# Patient Record
Sex: Male | Born: 1955 | Race: Black or African American | Hispanic: No | Marital: Single | Smoking: Never smoker
Health system: Southern US, Community
[De-identification: ages and names within clinical notes are randomized; demographics above are authoritative.]

---

## 2018-05-09 ENCOUNTER — Emergency Department
Admission: EM | Admit: 2018-05-09 | Discharge: 2018-05-09 | Disposition: A | Discharge status: Home / Self Care | Payer: 59 | Source: Home / Self Care | Attending: Emergency Medicine | Admitting: Emergency Medicine

## 2018-05-09 ENCOUNTER — Emergency Department (INDEPENDENT_AMBULATORY_CARE_PROVIDER_SITE_OTHER): Payer: 59

## 2018-05-09 ENCOUNTER — Encounter: Payer: Self-pay | Admitting: Emergency Medicine

## 2018-05-09 ENCOUNTER — Other Ambulatory Visit: Payer: Self-pay

## 2018-05-09 DIAGNOSIS — N429 Disorder of prostate, unspecified: Secondary | ICD-10-CM

## 2018-05-09 DIAGNOSIS — R079 Chest pain, unspecified: Secondary | ICD-10-CM

## 2018-05-09 DIAGNOSIS — M545 Low back pain: Secondary | ICD-10-CM | POA: Diagnosis not present

## 2018-05-09 DIAGNOSIS — R52 Pain, unspecified: Secondary | ICD-10-CM

## 2018-05-09 DIAGNOSIS — R109 Unspecified abdominal pain: Secondary | ICD-10-CM

## 2018-05-09 DIAGNOSIS — R0789 Other chest pain: Secondary | ICD-10-CM

## 2018-05-09 DIAGNOSIS — D649 Anemia, unspecified: Secondary | ICD-10-CM

## 2018-05-09 DIAGNOSIS — R937 Abnormal findings on diagnostic imaging of other parts of musculoskeletal system: Secondary | ICD-10-CM

## 2018-05-09 DIAGNOSIS — K5900 Constipation, unspecified: Secondary | ICD-10-CM | POA: Diagnosis not present

## 2018-05-09 DIAGNOSIS — M5442 Lumbago with sciatica, left side: Secondary | ICD-10-CM

## 2018-05-09 LAB — POCT CBC W AUTO DIFF (K'VILLE URGENT CARE)

## 2018-05-09 LAB — POCT URINALYSIS DIP (MANUAL ENTRY)
Blood, UA: NEGATIVE
Glucose, UA: NEGATIVE mg/dL
Leukocytes, UA: NEGATIVE
Nitrite, UA: NEGATIVE
Protein Ur, POC: 30 mg/dL — AB
Spec Grav, UA: 1.02 (ref 1.010–1.025)
Urobilinogen, UA: >=8 E.U./dL — AB
pH, UA: 7 (ref 5.0–8.0)

## 2018-05-09 MED ORDER — POLYETHYLENE GLYCOL 3350 17 G PO PACK: PACK | ORAL | 0 refills | Status: AC

## 2018-05-09 MED ORDER — CYCLOBENZAPRINE HCL 5 MG PO TABS: ORAL_TABLET | ORAL | 0 refills | Status: DC

## 2018-05-09 NOTE — ED Provider Notes (Addendum)
[facility]    CSN: [id number] Arrival date & time: 05/09/18  1140     History   Chief Complaint Chief Complaint  Patient presents with  . Back Pain  . Flank Pain  . Constipation    HPI Juan Horton is a 62 y.o. male.  Patient states he has not felt well for the last 3 weeks.  He has had pain left lateral lower chest wall as well as pain in his lower back but worse on the left.  He also has had some pain in the lower back on the right.  He has a very physical job.  He states he has not been sick he has had no nausea or vomiting but has had difficulty with bowel movements having his last bowel movement 5 days ago.  Also of note is he has not had a checkup in the last 4 to 5 years.  He had had urinary symptoms of frequency nocturia decreased stream and voiding small amounts. HPI  History reviewed. No pertinent past medical history.  There are no active problems to display for this patient.   History reviewed. No pertinent surgical history.     Home Medications    Prior to Admission medications   Medication Sig Start Date End Date Taking? Authorizing Provider  ibuprofen (ADVIL) 200 MG tablet Take 200 mg by mouth every 6 (six) hours as needed.   Yes [provider]  cyclobenzaprine (FLEXERIL) 5 MG tablet Take 1 to 2 tablets at night as needed for back pain. 05/09/18   Darlyne Russian, MD  polyethylene glycol (MIRALAX / GLYCOLAX) 17 g packet Use 1 packet twice a day for relief of your constipation 05/09/18   Darlyne Russian, MD    Family History History reviewed. No pertinent family history.  Social History Social History   Tobacco Use  . Smoking status: Never Smoker  . Smokeless tobacco: Never Used  Substance Use Topics  . Alcohol use: Not Currently  . Drug use: Not Currently     Allergies   Patient has no known allergies.   Review of Systems Review of Systems  Constitutional: Negative for activity change, appetite change, fever and unexpected  weight change.  HENT: Negative.   Eyes: Negative.   Respiratory: Negative.   Cardiovascular: Positive for chest pain.  Gastrointestinal: Positive for constipation. Negative for rectal pain and vomiting.  Endocrine: Negative.   Genitourinary: Positive for flank pain and frequency.       He gets up 2-3 times at night to urinate.  When he does urinate the stream is markedly diminished.  The symptoms have been going on for at least a month.  Musculoskeletal: Positive for back pain. Negative for neck pain.  Allergic/Immunologic: Negative.   Neurological: Negative.   Psychiatric/Behavioral: Negative.      Physical Exam Triage Vital Signs ED Triage Vitals  Enc Vitals Group     BP --      Pulse --      Resp --      Temp --      Temp src --      SpO2 --      Weight 05/09/18 1212 180 lb (81.6 kg)     Height 05/09/18 1212 6\' 1"  (1.854 m)     Head Circumference --      Peak Flow --      Pain Score 05/09/18 1211 8  Appear thank you   pain Loc --  Pain Edu? --      Excl. in [place]? --    No data found.  Updated Vital Signs BP 122/81 (BP Location: Right Arm)   Pulse 87   Temp 98.7 F (37.1 C) (Oral)   Ht 6\' 1"  (1.854 m)   Wt 81.6 kg   SpO2 100%   BMI 23.75 kg/m  Today's Vitals   05/09/18 1211 05/09/18 1212 05/09/18 1342 05/09/18 1354  BP:    122/81  Pulse:    87  Temp:    98.7 F (37.1 C)  TempSrc:    Oral  SpO2:    100%  Weight:  81.6 kg    Height:  6\' 1"  (1.854 m)    PainSc: 8   8     Body mass index is 23.75 kg/m. Visual Acuity Right Eye Distance:   Left Eye Distance:   Bilateral Distance:    Right Eye Near:   Left Eye Near:    Bilateral Near:     Physical Exam Constitutional:      Appearance: Normal appearance.  HENT:     Head: Normocephalic.  Eyes:     Conjunctiva/sclera: Conjunctivae normal.     Pupils: Pupils are equal, round, and reactive to light.  Neck:     Musculoskeletal: Normal range of motion and neck supple.  Cardiovascular:     Rate  and Rhythm: Normal rate and regular rhythm.     Pulses: Normal pulses.     Heart sounds: No murmur.  Pulmonary:     Comments: There were diminished breath sounds in both bases but breath sounds were symmetrical and there were no wheezes noted Abdominal:     Comments: The abdomen was flat.  There was no distention.  There was no tenderness to palpation.  Genitourinary:    Comments: The prostate gland was significantly enlarged, very irregular, rockhard to touch.  There were no areas of tenderness. Musculoskeletal:     Comments: There was tenderness in the lower lumbar spine worse at the right SI joint.  There was no weakness noted of the lower extremities.  Skin:    General: Skin is warm and dry.  Neurological:     General: No focal deficit present.     Mental Status: He is alert and oriented to person, place, and time.  Psychiatric:        Mood and Affect: Mood normal.        Behavior: Behavior normal.        Thought Content: Thought content normal.   White count 8000 72% segs Hemoglobin 12.3 Platelet 335,000.   UC Treatments / Results  Labs (all labs ordered are listed, but only abnormal results are displayed) Labs Reviewed  POCT URINALYSIS DIP (MANUAL ENTRY) - Abnormal; Notable for the following components:      Result Value   Color, UA other (*)    Bilirubin, UA small (*)    Ketones, POC UA trace (5) (*)    Protein Ur, POC =30 (*)    Urobilinogen, UA >=8.0 (*)    All other components within normal limits  COMPLETE METABOLIC PANEL WITH GFR  PSA  POCT CBC W AUTO DIFF ([facility])  POCT URINALYSIS DIP (MANUAL ENTRY)    EKG None  Radiology Dg Chest 2 View  Result Date: 05/09/2018 CLINICAL DATA:  Left-sided chest pain.  No known injury. EXAM: CHEST - 2 VIEW COMPARISON:  None. FINDINGS: Heart size is normal. Mediastinal shadows are normal. The lungs are  clear. No bronchial thickening. No infiltrate, mass, effusion or collapse. Pulmonary vascularity is normal.  There are bridging osteophytes in the thoracic spine. No sign of thoracic fracture. No sclerotic changes noted in the thoracic spine or ribs. IMPRESSION: No active cardiopulmonary disease. Electronically Signed   By: Nelson Chimes M.D.   On: 05/09/2018 12:52   Dg Lumbar Spine 2-3 Views  Result Date: 05/09/2018 CLINICAL DATA:  Low back pain and left leg pain with no known injury. EXAM: LUMBAR SPINE - 2-3 VIEW COMPARISON:  None. FINDINGS: Five lumbar type vertebral bodies show normal alignment. No disc space narrowing. Apparent sclerotic change of the L2 and L3 vertebral bodies. This could be due to Paget's disease of bone or metastatic disease such as with prostate cancer. Question some sclerotic area within the left sacrum. Patient has sacroiliac arthritis right more than left. IMPRESSION: Sclerotic changes of the L2 and L3 vertebral bodies and possibly within the left sacrum. Findings could be due to Paget's disease or sclerotic metastatic disease such as could be seen prostate cancer. Sacroiliac arthritis right more than left. Electronically Signed   By: Nelson Chimes M.D.   On: 05/09/2018 12:51   Dg Abd 2 Views  Result Date: 05/09/2018 CLINICAL DATA:  Low back pain 3 weeks. EXAM: ABDOMEN - 2 VIEW COMPARISON:  None. FINDINGS: Bowel gas pattern is nonobstructive with mild fecal retention over the ascending and descending colon. No free peritoneal air. No significant air-fluid levels. Few pelvic phleboliths are present. Remaining bony structures are within normal. IMPRESSION: Negative. Electronically Signed   By: Marin Olp M.D.   On: 05/09/2018 13:14    Procedures Procedures (including critical care time)  Medications Ordered in UC Medications - No data to display  Initial Impression / Assessment and Plan / UC Course  I have reviewed the triage vital signs and the nursing notes.  Pertinent labs & imaging results that were available during my care of the patient were reviewed by me and considered  in my medical decision making (see chart for details). I am very concerned that the patient has prostate cancer.  His prostate is significantly enlarged firm and nodular.  He has tenderness in his left flank area as well as tenderness across the lower back worse on the right.  Routine blood work will be done as well as a PSA.  Need to be checking on his calcium level as well as his BUN and creatinine.  Films will be done of the low back as well as chest.  Of note he has a normocytic anemia.  He has not had any colon screening or prostate screening.  X-ray results reviewed of his lumbar spine with sclerotic areas in the vertebral bodies of L2 and L3 also in the sacrum worrisome for metastatic prostate cancer versus the possibility of Paget's.  I have discussed the situation with Lynne Leader and he will see the patient on Monday I had a long discussion with the patient and his wife.  I advised him I was very concerned about the possibility of prostate cancer with bony metastasis.  I told him we would need to do the testing to see what is going on.  He is agreeable to see Dr. Georgina Snell on Monday for follow-up.  I will call him with lab results as they come in.     Final Clinical Impressions(s) / UC Diagnoses   Final diagnoses:  Abnormality detected on rectal examination of prostate  Flank pain  Constipation, unspecified constipation type  Left-sided chest wall pain  Left-sided low back pain with left-sided sciatica, unspecified chronicity  Normocytic anemia  Abnormal x-ray of lumbar spine     Discharge Instructions     Take ibuprofen 2 tablets every 6-8 hours for pain. Alternate ice and heat to your low back. Use MiraLAX 1 dose twice a day until you get relief of your constipation. Take Flexeril 5 mg at night for pain. Follow-up with Dr. Georgina Snell on Monday. I will call you with test results.    ED Prescriptions    Medication Sig Dispense Auth. Provider   cyclobenzaprine (FLEXERIL) 5 MG tablet  Take 1 to 2 tablets at night as needed for back pain. 20 tablet Darlyne Russian, MD   polyethylene glycol (MIRALAX / GLYCOLAX) 17 g packet Use 1 packet twice a day for relief of your constipation 14 each Evangelyne Loja, Loura Back, MD     Controlled Substance Prescriptions [place] Controlled Substance Registry consulted? Not Applicable   Darlyne Russian, MD 05/09/18 1344    Darlyne Russian, MD 05/09/18 1400

## 2018-05-09 NOTE — Discharge Instructions (Signed)
Take ibuprofen 2 tablets every 6-8 hours for pain. Alternate ice and heat to your low back. Use MiraLAX 1 dose twice a day until you get relief of your constipation. Take Flexeril 5 mg at night for pain. Follow-up with Dr. Georgina Snell on Monday. I will call you with test results.

## 2018-05-09 NOTE — ED Triage Notes (Signed)
LBP, constipation x 3 weeks, today LEFT flank pain

## 2018-05-10 ENCOUNTER — Telehealth: Payer: Self-pay

## 2018-05-10 LAB — COMPLETE METABOLIC PANEL WITH GFR
AG Ratio: 1.5 (calc) (ref 1.0–2.5)
ALT: 9 U/L (ref 9–46)
AST: 42 U/L — ABNORMAL HIGH (ref 10–35)
Albumin: 4.4 g/dL (ref 3.6–5.1)
Alkaline phosphatase (APISO): 886 U/L — ABNORMAL HIGH (ref 35–144)
BUN: 15 mg/dL (ref 7–25)
CO2: 25 mmol/L (ref 20–32)
Calcium: 9.7 mg/dL (ref 8.6–10.3)
Chloride: 101 mmol/L (ref 98–110)
Creat: 0.89 mg/dL (ref 0.70–1.25)
GFR, Est African American: 105 mL/min/{1.73_m2} (ref >=60)
GFR, Est Non African American: 91 mL/min/{1.73_m2} (ref >=60)
Globulin: 3 g/dL (calc) (ref 1.9–3.7)
Glucose, Bld: 97 mg/dL (ref 65–99)
Potassium: 4.2 mmol/L (ref 3.5–5.3)
Sodium: 138 mmol/L (ref 135–146)
Total Bilirubin: 0.9 mg/dL (ref 0.2–1.2)
Total Protein: 7.4 g/dL (ref 6.1–8.1)

## 2018-05-10 LAB — PSA: PSA: 753.3 ng/mL — ABNORMAL HIGH (ref <=4.0)

## 2018-05-10 LAB — EXTRA LAV TOP TUBE

## 2018-05-10 NOTE — Telephone Encounter (Signed)
Per Dr Everlene Farrier, attempting to call pt with lab results. Has f/u with Dr T next week.

## 2018-05-10 NOTE — Telephone Encounter (Signed)
Dr Everlene Farrier was able to speak with pt. Requesting labs be sent to Dr Georgina Snell for f/u on Monday.

## 2018-05-12 ENCOUNTER — Encounter: Payer: Self-pay | Admitting: Family Medicine

## 2018-05-12 ENCOUNTER — Ambulatory Visit (INDEPENDENT_AMBULATORY_CARE_PROVIDER_SITE_OTHER): Payer: 59 | Admitting: Family Medicine

## 2018-05-12 ENCOUNTER — Telehealth: Payer: Self-pay | Admitting: Family Medicine

## 2018-05-12 DIAGNOSIS — R972 Elevated prostate specific antigen [PSA]: Secondary | ICD-10-CM | POA: Diagnosis not present

## 2018-05-12 DIAGNOSIS — M899 Disorder of bone, unspecified: Secondary | ICD-10-CM

## 2018-05-12 HISTORY — DX: Elevated prostate specific antigen (PSA): R97.20

## 2018-05-12 MED ORDER — IBUPROFEN 800 MG PO TABS: 800.0000 mg | ORAL_TABLET | Freq: Three times a day (TID) | ORAL | 2 refills | Status: DC | PRN

## 2018-05-12 NOTE — Telephone Encounter (Signed)
Patient will be following up with me today.  In urgent care PSA was greater than 700 and x-ray lesions concerning for prostate cancer.  Plan to urgently refer to urology.

## 2018-05-12 NOTE — Patient Instructions (Addendum)
Thank you for coming in today.  You should hear from urology soon.  Let me know if you do not hear anything this week.  Keep me updated.  We will schedule a video or phone visit in 2 weeks to check back.  We can turn that into a office visit if needed.   Please sign up for mychart.   If you need anything soon call triage in my office.    Prostate Cancer  The prostate is a walnut-sized gland that is involved in the production of semen. It is located below a man's bladder, in front of the rectum. Prostate cancer is the abnormal growth of cells in the prostate gland. What are the causes? The exact cause of this condition is not known. What increases the risk? This condition is more likely to develop in men who:  Are older than age 44.  Are African-American.  Are obese.  Have a family history of prostate cancer.  Have a family history of breast cancer. What are the signs or symptoms? Symptoms of this condition include:  A need to urinate often.  Weak or interrupted flow of urine.  Trouble starting or stopping urination.  Inability to urinate.  Pain or burning during urination.  Painful ejaculation.  Blood in urine or semen.  Persistent pain or discomfort in the lower back, lower abdomen, hips, or upper thighs.  Trouble getting an erection.  Trouble emptying the bladder all the way. How is this diagnosed? This condition can be diagnosed with:  A digital rectal exam. For this exam, a health care provider inserts a gloved finger into the rectum to feel the prostate gland.  A blood test called a prostate-specific antigen (PSA) test.  An imaging test called transrectal ultrasonography.  A procedure in which a sample of tissue is taken from the prostate and examined under a microscope (prostate biopsy). Once the condition is diagnosed, tests will be done to determine how far the cancer has spread. This is called staging the cancer. Staging may involve imaging  tests, such as:  A bone scan.  A CT scan.  A PET scan.  An MRI. The stages of prostate cancer are as follows:  Stage I. At this stage, the cancer is found in the prostate only. The cancer is not visible on imaging tests and it is usually found by accident, such as during a prostate surgery.  Stage II. At this stage, the cancer is more advanced than it is in stage I, but the cancer has not spread outside the prostate.  Stage III. At this stage, the cancer has spread beyond the outer layer of the prostate to nearby tissues. The cancer may be found in the seminal vesicles, which are near the bladder and the prostate.  Stage IV. At this stage, the cancer has spread other parts of the body, such as the lymph nodes, bones, bladder, rectum, liver, or lungs. How is this treated? Treatment for this condition depends on several factors, including the stage of the cancer, your age, personal preferences, and your overall health. Talk with your health care provider about treatment options that are recommended for you. Common treatments include:  Observation for early stage prostate cancer (active surveillance). This involves having exams, blood tests, and in some cases, more biopsies. For some men, this is the only treatment needed.  Surgery. Types of surgeries include: ? Open surgery. In this surgery, a larger incision is made to remove the prostate. ? A laparoscopic prostatectomy. This is  a surgery to remove the prostate and lymph nodes through several, small incisions. It is often referred to as a minimally invasive surgery. ? A robotic prostatectomy. This is a surgery to remove the prostate and lymph nodes with the help of a robotic arm that is controlled by a computer. ? Orchiectomy. This is a surgery to remove the testicles. ? Cryosurgery. This is a surgery to freeze and destroy cancer cells.  Radiation treatment. Types of radiation treatment include: ? External beam radiation. This type aims  beams of radiation from outside the body at the prostate to destroy cancerous cells. ? Brachytherapy. This type uses radioactive needles, seeds, wires, or tubes that are implanted into the prostate gland. Like external beam radiation, brachytherapy destroys cancerous cells. An advantage is that this type of radiation limits the damage to surrounding tissue and has fewer side effects.  High-intensity, focused ultrasonography. This treatment destroys cancer cells by delivering high-energy ultrasound waves to the cancerous cells.  Chemotherapy medicines. This treatment kills cancer cells or stops them from multiplying.  Hormone treatment. This treatment involves taking medicines that act on one of the male hormones (testosterone): ? By stopping your body from producing testosterone. ? By blocking testosterone from reaching cancer cells. Follow these instructions at home:  Take over-the-counter and prescription medicines only as told by your health care provider.  Maintain a healthy diet.  Get plenty of sleep.  Consider joining a support group for men who have prostate cancer. Meeting with a support group may help you learn to cope with the stress of having cancer.  Keep all follow-up visits as told by your health care provider. This is important.  If you have to go to the hospital, notify your cancer specialist (oncologist).  Treatment for prostate cancer may affect sexual function. Continue to have intimate moments with your partner. This may include touching, holding, hugging, and caressing. Contact a health care provider if:  You have trouble urinating.  You have blood in your urine.  You have pain in your hips, back, or chest. Get help right away if:  You have weakness or numbness in your legs.  You cannot control urination or your bowel movements (incontinence).  You have trouble breathing.  You have sudden chest pain.  You have chills or a fever. Summary  The prostate  is a walnut-sized gland that is involved in the production of semen. It is located below a man's bladder, in front of the rectum. Prostate cancer is the abnormal growth of cells in the prostate gland.  Treatment for this condition depends on several factors, including the stage of the cancer, your age, personal preferences, and your overall health. Talk with your health care provider about treatment options that are recommended for you.  Consider joining a support group for men who have prostate cancer. Meeting with a support group may help you learn to cope with the stress of having cancer. This information is not intended to replace advice given to you by your health care provider. Make sure you discuss any questions you have with your health care provider. Document Released: 12/18/2004 Document Revised: 08/22/2016 Document Reviewed: 08/29/2015 Elsevier Interactive Patient Education  2019 [employer].

## 2018-05-12 NOTE — Progress Notes (Signed)
Juan Horton is a 63 y.o. male who presents to [place]: [place] today for establish care and discuss back pain x-ray findings and increased PSA seen in urgent care on Friday.  Patient was seen in the urgent care associated with my clinic on Friday, May 8.  He had been complaining of back pain and a 20 pound weight loss.  During that visit he had a large swollen prostate that was firm.  Additionally x-ray showed lesions concerning for metastatic changes and subsequent labs showed elevated PSA above 700.  He is here today to follow-up.  He notes continued back pain.  He has been taking occasional ibuprofen which does help some.  He does deny any fevers or chills or night sweats.  He is able to urinate normally.  He notes the MiraLAX has helped with constipation a bit as well.   ROS as above: No fevers or chills nausea vomiting or diarrhea.  Positive for constipation weight loss muscle aches and pains.   Exam:  BP (!) 165/69   Pulse 90   Wt 175 lb (79.4 kg)   BMI 23.09 kg/m  Wt Readings from Last 5 Encounters:  05/12/18 175 lb (79.4 kg)  05/09/18 180 lb (81.6 kg)    Gen: Well NAD HEENT: EOMI,  MMM Lungs: Normal work of breathing. CTABL Heart: RRR no MRG Abd: NABS, Soft. Nondistended, Nontender Exts: Brisk capillary refill, warm and well perfused.  L-spine: Nontender to spinal midline.  Mildly tender to palpation bilateral lumbar paraspinal musculature.  Lab and Radiology Results No results found for this or any previous visit (from the past 72 hour(s)). Dg Chest 2 View  Result Date: 05/09/2018 CLINICAL DATA:  Left-sided chest pain.  No known injury. EXAM: CHEST - 2 VIEW COMPARISON:  None. FINDINGS: Heart size is normal. Mediastinal shadows are normal. The lungs are clear. No bronchial thickening. No infiltrate, mass, effusion or collapse. Pulmonary vascularity is normal.  There are bridging osteophytes in the thoracic spine. No sign of thoracic fracture. No sclerotic changes noted in the thoracic spine or ribs. IMPRESSION: No active cardiopulmonary disease. Electronically Signed   By: Nelson Chimes M.D.   On: 05/09/2018 12:52   Dg Lumbar Spine 2-3 Views  Result Date: 05/09/2018 CLINICAL DATA:  Low back pain and left leg pain with no known injury. EXAM: LUMBAR SPINE - 2-3 VIEW COMPARISON:  None. FINDINGS: Five lumbar type vertebral bodies show normal alignment. No disc space narrowing. Apparent sclerotic change of the L2 and L3 vertebral bodies. This could be due to Paget's disease of bone or metastatic disease such as with prostate cancer. Question some sclerotic area within the left sacrum. Patient has sacroiliac arthritis right more than left. IMPRESSION: Sclerotic changes of the L2 and L3 vertebral bodies and possibly within the left sacrum. Findings could be due to Paget's disease or sclerotic metastatic disease such as could be seen prostate cancer. Sacroiliac arthritis right more than left. Electronically Signed   By: Nelson Chimes M.D.   On: 05/09/2018 12:51   Dg Abd 2 Views  Result Date: 05/09/2018 CLINICAL DATA:  Low back pain 3 weeks. EXAM: ABDOMEN - 2 VIEW COMPARISON:  None. FINDINGS: Bowel gas pattern is nonobstructive with mild fecal retention over the ascending and descending colon. No free peritoneal air. No significant air-fluid levels. Few pelvic phleboliths are present. Remaining bony structures are within normal. IMPRESSION: Negative. Electronically Signed   By: Marin Olp M.D.  On: 05/09/2018 13:14   I personally (independently) visualized and performed the interpretation of the images attached in this note. Recent Results (from the past 2160 hour(s))  POCT CBC w auto diff     Status: None   Collection Time: 05/09/18 12:42 PM  Result Value Ref Range   WBC      Comment: See scanned results   Lymphocytes relative %     Monocytes relative %      Neutrophils relative % (GR)     Lymphocytes absolute     Monocyes absolute     Neutrophils absolute (GR#)     RBC     Hemoglobin     Hematocrit     MCV     MCH     MCHC     RDW     Platelet count     MPV    POCT urinalysis dipstick     Status: Abnormal   Collection Time: 05/09/18 12:42 PM  Result Value Ref Range   Color, UA other (A) yellow   Clarity, UA clear clear   Glucose, UA negative negative mg/dL   Bilirubin, UA small (A) negative   Ketones, POC UA trace (5) (A) negative mg/dL   Spec Grav, UA 1.020 1.010 - 1.025   Blood, UA negative negative   pH, UA 7.0 5.0 - 8.0   Protein Ur, POC =30 (A) negative mg/dL   Urobilinogen, UA >=8.0 (A) 0.2 or 1.0 E.U./dL   Nitrite, UA Negative Negative   Leukocytes, UA Negative Negative  COMPLETE METABOLIC PANEL WITH GFR     Status: Abnormal   Collection Time: 05/09/18 12:42 PM  Result Value Ref Range   Glucose, Bld 97 65 - 99 mg/dL    Comment: .            Fasting reference interval .    BUN 15 7 - 25 mg/dL   Creat 0.89 0.70 - 1.25 mg/dL    Comment: For patients >12 years of age, the reference limit for Creatinine is approximately 13% higher for people identified as African-American. .    GFR, Est Non African American 91 > OR = 60 mL/min/1.28m2   GFR, Est African American 105 > OR = 60 mL/min/1.61m2   BUN/Creatinine Ratio NOT APPLICABLE 6 - 22 (calc)   Sodium 138 135 - 146 mmol/L   Potassium 4.2 3.5 - 5.3 mmol/L   Chloride 101 98 - 110 mmol/L   CO2 25 20 - 32 mmol/L   Calcium 9.7 8.6 - 10.3 mg/dL   Total Protein 7.4 6.1 - 8.1 g/dL   Albumin 4.4 3.6 - 5.1 g/dL   Globulin 3.0 1.9 - 3.7 g/dL (calc)   AG Ratio 1.5 1.0 - 2.5 (calc)   Total Bilirubin 0.9 0.2 - 1.2 mg/dL   Alkaline phosphatase (APISO) 886 (H) 35 - 144 U/L   AST 42 (H) 10 - 35 U/L   ALT 9 9 - 46 U/L  PSA     Status: Abnormal   Collection Time: 05/09/18 12:42 PM  Result Value Ref Range   PSA 753.3 (H) < OR = 4.0 ng/mL    Comment: Verified by repeat analysis.  . The total PSA value from this assay system is  standardized against the WHO standard. The test  result will be approximately 20% lower when compared  to the equimolar-standardized total PSA (Beckman  Coulter). Comparison of serial PSA results should be  interpreted with this fact in mind. . This test was  performed using the Siemens  chemiluminescent method. Values obtained from  different assay methods cannot be used interchangeably. PSA levels, regardless of value, should not be interpreted as absolute evidence of the presence or absence of disease.   EXTRA LAV TOP TUBE     Status: None   Collection Time: 05/09/18 12:42 PM  Result Value Ref Range   EXTRA LAVENDER-TOP TUBE      Comment: We received an extra specimen with no test requested. If any test is desired for this specimen please call client services and advise.       Assessment and Plan: 63 y.o. male with  Metastatic prostate cancer.  Very likely would explain his cluster of symptoms and lab results.  Patient has an enlarged prostate on exam in urgent care as well as a very elevated PSA and metastatic appearing lesions on x-ray.  Plan for urgent referral to urology.  Work for pain control with ibuprofen and recheck in my clinic in about 2 weeks via video visit.  Lengthy discussion with patient and his fiance about findings and next steps.  Additionally discussed his blood pressure.  In urgent care the other day his blood pressure was actually pretty well controlled.  Plan for watchful waiting and readjustment as needed.    PDMP not reviewed this encounter. No orders of the defined types were placed in this encounter.  Meds ordered this encounter  Medications  . ibuprofen (ADVIL) 800 MG tablet    Sig: Take 1 tablet (800 mg total) by mouth every 8 (eight) hours as needed.    Dispense:  60 tablet    Refill:  2     Historical information moved to improve visibility of documentation.  Past Medical History:   Diagnosis Date  . Elevated PSA 05/12/2018   History reviewed. No pertinent surgical history. Social History   Tobacco Use  . Smoking status: Never Smoker  . Smokeless tobacco: Never Used  Substance Use Topics  . Alcohol use: Not Currently   family history is not on file.  Medications: Current Outpatient Medications  Medication Sig Dispense Refill  . cyclobenzaprine (FLEXERIL) 5 MG tablet Take 1 to 2 tablets at night as needed for back pain. 20 tablet 0  . ibuprofen (ADVIL) 800 MG tablet Take 1 tablet (800 mg total) by mouth every 8 (eight) hours as needed. 60 tablet 2  . polyethylene glycol (MIRALAX / GLYCOLAX) 17 g packet Use 1 packet twice a day for relief of your constipation 14 each 0   No current facility-administered medications for this visit.    No Known Allergies   Discussed warning signs or symptoms. Please see discharge instructions. Patient expresses understanding.

## 2018-05-21 ENCOUNTER — Telehealth: Payer: Self-pay | Admitting: Family Medicine

## 2018-05-21 MED ORDER — TRAMADOL HCL 50 MG PO TABS: 50.0000 mg | ORAL_TABLET | Freq: Three times a day (TID) | ORAL | 0 refills | Status: DC | PRN

## 2018-05-21 NOTE — Telephone Encounter (Signed)
Patient was notified and voices understanding. No further questions during the phone call. Patient was advised that authorization may pop up and if so I would work on it in a timely manner and he voices understanding.

## 2018-05-21 NOTE — Telephone Encounter (Signed)
Patient called back and his lower back is hurting and the pain was worse last night and the Ibuprofen was not helping and he took all the Flexeril. It did not seem to help much. He is requesting something stronger for pain. He had a biopsy this morning and did not specify where but that didn't help the pain. Please advise.

## 2018-05-21 NOTE — Telephone Encounter (Signed)
Will use tramadol.  Tramadol is an opiate but can be more effective for pain.  Medication sent to [facility].

## 2018-05-24 DIAGNOSIS — C61 Malignant neoplasm of prostate: Secondary | ICD-10-CM | POA: Insufficient documentation

## 2018-05-27 ENCOUNTER — Encounter: Payer: Self-pay | Admitting: Family Medicine

## 2018-05-27 ENCOUNTER — Ambulatory Visit (INDEPENDENT_AMBULATORY_CARE_PROVIDER_SITE_OTHER): Payer: 59 | Admitting: Family Medicine

## 2018-05-27 VITALS — Ht 71.0 in | Wt 175.0 lb

## 2018-05-27 DIAGNOSIS — G893 Neoplasm related pain (acute) (chronic): Secondary | ICD-10-CM | POA: Diagnosis not present

## 2018-05-27 DIAGNOSIS — M899 Disorder of bone, unspecified: Secondary | ICD-10-CM | POA: Diagnosis not present

## 2018-05-27 DIAGNOSIS — G8929 Other chronic pain: Secondary | ICD-10-CM

## 2018-05-27 DIAGNOSIS — C61 Malignant neoplasm of prostate: Secondary | ICD-10-CM

## 2018-05-27 HISTORY — DX: Other chronic pain: G89.29

## 2018-05-27 MED ORDER — TRAMADOL HCL 50 MG PO TABS: 50.0000 mg | ORAL_TABLET | Freq: Three times a day (TID) | ORAL | 0 refills | Status: AC

## 2018-05-27 MED ORDER — IBUPROFEN 800 MG PO TABS: 800.0000 mg | ORAL_TABLET | Freq: Three times a day (TID) | ORAL | 2 refills | Status: AC | PRN

## 2018-05-27 NOTE — Progress Notes (Signed)
Virtual Visit  via Video Note  I connected with      Juan Horton by a video enabled telemedicine application and verified that I am speaking with the correct person using two identifiers.   I discussed the limitations of evaluation and management by telemedicine and the availability of in person appointments. The patient expressed understanding and agreed to proceed.  History of Present Illness: Juan Horton is a 63 y.o. male who would like to discuss follow-up back pain.  Patient was originally seen on May 11 for back pain.  He had concerning lesions on x-ray as well as elevated PSA enlarged prostate.  Metastatic prostate cancer was suspected and he was referred to urology who confirmed diagnosis via biopsy.  In the interim he has had GnRH antagonist therapy and is in the process of being set up for radiation therapy.  He notes with ibuprofen and tramadol his pain is reasonably controlled.  He denies fevers chills nausea vomiting or diarrhea.     Observations/Objective: Ht 5\' 11"  (1.803 m)   Wt 175 lb (79.4 kg)   BMI 24.41 kg/m  Wt Readings from Last 5 Encounters:  05/27/18 175 lb (79.4 kg)  05/12/18 175 lb (79.4 kg)  05/09/18 180 lb (81.6 kg)   Exam:  Normal Speech.    Assessment and Plan: 63 y.o. male with  Back pain.  Controlled.  Plan to continue ibuprofen and hope to wean tramadol.  New prescription sent.  Recheck in 1 month.  Return sooner if needed.  Continue to follow care at [facility] with urology, RadOnc, and oncology.  Hopeful that patient with better controlled pain now with GnRH antagonist therapy.  PDMP reviewed during this encounter. No orders of the defined types were placed in this encounter.  Meds ordered this encounter  Medications  . traMADol (ULTRAM) 50 MG tablet    Sig: Take 1 tablet (50 mg total) by mouth every 8 (eight) hours.    Dispense:  90 tablet    Refill:  0    Chronic cancer pain  . ibuprofen (ADVIL) 800 MG tablet    Sig: Take 1 tablet (800  mg total) by mouth every 8 (eight) hours as needed.    Dispense:  90 tablet    Refill:  2    Follow Up Instructions:    I discussed the assessment and treatment plan with the patient. The patient was provided an opportunity to ask questions and all were answered. The patient agreed with the plan and demonstrated an understanding of the instructions.   The patient was advised to call back or seek an in-person evaluation if the symptoms worsen or if the condition fails to improve as anticipated.  Time: 15 minutes of intraservice time, with >22 minutes of total time during today's visit.      Historical information moved to improve visibility of documentation.  Past Medical History:  Diagnosis Date  . Elevated PSA 05/12/2018   No past surgical history on file. Social History   Tobacco Use  . Smoking status: Never Smoker  . Smokeless tobacco: Never Used  Substance Use Topics  . Alcohol use: Not Currently   family history is not on file.  Medications: Current Outpatient Medications  Medication Sig Dispense Refill  . calcium carbonate (OS-CAL) 1250 (500 Ca) MG chewable tablet Chew by mouth.    . DEGARELIX ACETATE,240 MG DOSE, [place] Inject into the skin. once    . ibuprofen (ADVIL) 800 MG tablet Take 1 tablet (800  mg total) by mouth every 8 (eight) hours as needed. 90 tablet 2  . leuprolide (LUPRON) 11.25 MG injection Inject 11.25 mg into the muscle every 3 (three) months. Per urology    . polyethylene glycol (MIRALAX / GLYCOLAX) 17 g packet Use 1 packet twice a day for relief of your constipation 14 each 0  . traMADol (ULTRAM) 50 MG tablet Take 1 tablet (50 mg total) by mouth every 8 (eight) hours. 90 tablet 0  . Vitamin D, Cholecalciferol, 25 MCG (1000 UT) CAPS Take by mouth.     No current facility-administered medications for this visit.    No Known Allergies

## 2018-05-27 NOTE — Patient Instructions (Addendum)
Thank you for coming in today.    

## 2018-06-13 DIAGNOSIS — C7951 Secondary malignant neoplasm of bone: Secondary | ICD-10-CM | POA: Insufficient documentation

## 2018-06-24 ENCOUNTER — Ambulatory Visit (INDEPENDENT_AMBULATORY_CARE_PROVIDER_SITE_OTHER): Payer: 59 | Admitting: Family Medicine

## 2018-06-24 ENCOUNTER — Encounter: Payer: Self-pay | Admitting: Family Medicine

## 2018-06-24 VITALS — BP 151/103 | HR 83 | Temp 97.9°F | Wt 174.0 lb

## 2018-06-24 DIAGNOSIS — G893 Neoplasm related pain (acute) (chronic): Secondary | ICD-10-CM

## 2018-06-24 DIAGNOSIS — I10 Essential (primary) hypertension: Secondary | ICD-10-CM | POA: Diagnosis not present

## 2018-06-24 DIAGNOSIS — C7951 Secondary malignant neoplasm of bone: Secondary | ICD-10-CM

## 2018-06-24 DIAGNOSIS — C61 Malignant neoplasm of prostate: Secondary | ICD-10-CM | POA: Diagnosis not present

## 2018-06-24 MED ORDER — LISINOPRIL-HYDROCHLOROTHIAZIDE 10-12.5 MG PO TABS: 1.0000 | ORAL_TABLET | Freq: Every day | ORAL | 1 refills | Status: DC

## 2018-06-24 NOTE — Progress Notes (Signed)
Nicholus Chandran is a 63 y.o. male who presents to [place]: [place] today for follow-up prostate cancer chronic chronic pain, and discuss hypertension.  Patient was seen for his first initial visit in early May after an urgent care visit showed labs and x-ray findings concerning for prostate cancer.  His PSA was significantly elevated and he was referred immediately to urology.  He has had further testing that has confirmed metastatic prostate cancer.  Fortunately he has had significantly good response to antiandrogen treatment as well as x-ray therapy.  He notes his pain is much better controlled.  Additionally during his first initial visits he had elevated blood pressure.  This was placed on the back burner as I was more focused on his prostate cancer.  He notes that he is never been on any sort of blood pressure control.  He denies chest pain palpitations or shortness of breath lightheadedness or dizziness.   ROS as above:  Exam:  BP (!) 151/103   Pulse 83   Temp 97.9 F (36.6 C) (Oral)   Wt 174 lb (78.9 kg)   BMI 24.27 kg/m  Wt Readings from Last 5 Encounters:  06/24/18 174 lb (78.9 kg)  05/27/18 175 lb (79.4 kg)  05/12/18 175 lb (79.4 kg)  05/09/18 180 lb (81.6 kg)    Gen: Well NAD HEENT: EOMI,  MMM Lungs: Normal work of breathing. CTABL Heart: RRR no MRG Abd: NABS, Soft. Nondistended, Nontender Exts: Brisk capillary refill, warm and well perfused.     Chemistry      Component Value Date/Time   NA 138 05/09/2018 1242   K 4.2 05/09/2018 1242   CL 101 05/09/2018 1242   CO2 25 05/09/2018 1242   BUN 15 05/09/2018 1242   CREATININE 0.89 05/09/2018 1242      Component Value Date/Time   CALCIUM 9.7 05/09/2018 1242   AST 42 (H) 05/09/2018 1242   ALT 9 05/09/2018 1242   BILITOT 0.9 05/09/2018 1242        Assessment and Plan: 63 y.o. male with   Hypertension: Blood pressure elevated today.  Plan to start lisinopril/hydrochlorothiazide.  Creatinine checked about 6 weeks ago and was stable.  Plan to recheck in about a month.  Chronic pain: Better controlled with prostate cancer control.  Will wean off of tramadol.  Recheck as needed.  Prostate cancer: Management with urology.  Recheck with me as needed however primary care for this will be with urology.  [facility] care team including urology hematology oncology and RadOnc. Meds ordered this encounter  Medications  . lisinopril-hydrochlorothiazide (ZESTORETIC) 10-12.5 MG tablet    Sig: Take 1 tablet by mouth daily.    Dispense:  90 tablet    Refill:  1     Historical information moved to improve visibility of documentation.  Past Medical History:  Diagnosis Date  . Chronic pain 05/27/2018  . Elevated PSA 05/12/2018  . HTN (hypertension) 06/25/2018   No past surgical history on file. Social History   Tobacco Use  . Smoking status: Never Smoker  . Smokeless tobacco: Never Used  Substance Use Topics  . Alcohol use: Not Currently   family history is not on file.  Medications: Current Outpatient Medications  Medication Sig Dispense Refill  . calcium carbonate (OS-CAL) 1250 (500 Ca) MG chewable tablet Chew by mouth.    . DEGARELIX ACETATE,240 MG DOSE, [place] Inject into the skin. once    . ibuprofen (ADVIL)  800 MG tablet Take 1 tablet (800 mg total) by mouth every 8 (eight) hours as needed. 90 tablet 2  . leuprolide (LUPRON) 11.25 MG injection Inject 11.25 mg into the muscle every 3 (three) months. Per urology    . polyethylene glycol (MIRALAX / GLYCOLAX) 17 g packet Use 1 packet twice a day for relief of your constipation 14 each 0  . traMADol (ULTRAM) 50 MG tablet Take 1 tablet (50 mg total) by mouth every 8 (eight) hours. 90 tablet 0  . Vitamin D, Cholecalciferol, 25 MCG (1000 UT) CAPS Take by mouth.    Marland Kitchen lisinopril-hydrochlorothiazide (ZESTORETIC) 10-12.5 MG tablet Take 1 tablet by  mouth daily. 90 tablet 1   No current facility-administered medications for this visit.    No Known Allergies   Discussed warning signs or symptoms. Please see discharge instructions. Patient expresses understanding.

## 2018-06-24 NOTE — Patient Instructions (Addendum)
Thank you for coming in today. Try to wean off tramadol.  Try taking 1/2 or no tramadol.  Ok to take tylenol if needed.    For blood pressure take lisinopril/HCTZ. Recheck in 1 month. We will check blood pressure and kidney function then.   I am so happy that you are feeling better.   Hydrochlorothiazide, HCTZ; Lisinopril tablets What is this medicine? HYDROCHLOROTHIAZIDE; LISINOPRIL (hye droe klor oh THYE a zide; lyse IN oh pril) is a combination of a diuretic and an ACE inhibitor. It is used to treat high blood pressure. This medicine may be used for other purposes; ask your health care provider or pharmacist if you have questions. COMMON BRAND NAME(S): Prinzide, Zestoretic What should I tell my health care provider before I take this medicine? They need to know if you have any of these conditions: -bone marrow disease -decreased urine -heart or blood vessel disease -if you are on a special diet like a low salt diet -immune system problems, like lupus -kidney disease -liver disease -previous swelling of the tongue, face, or lips with difficulty breathing, difficulty swallowing, hoarseness, or tightening of the throat -recent heart attack or stroke -an unusual or allergic reaction to lisinopril, hydrochlorothiazide, sulfa drugs, other medicines, insect venom, foods, dyes, or preservatives -pregnant or trying to get pregnant -breast-feeding How should I use this medicine? Take this medicine by mouth with a glass of water. Follow the directions on the prescription label. You can take it with or without food. If it upsets your stomach, take it with food. Take your medicine at regular intervals. Do not take it more often than directed. Do not stop taking except on your doctor's advice. Talk to your pediatrician regarding the use of this medicine in children. Special care may be needed. Overdosage: If you think you have taken too much of this medicine contact a poison control center or  emergency room at once. NOTE: This medicine is only for you. Do not share this medicine with others. What if I miss a dose? If you miss a dose, take it as soon as you can. If it is almost time for your next dose, take only that dose. Do not take double or extra doses. What may interact with this medicine? Do not take this medication with any of the following medications: -sacubitril; valsartan This medicine may also interact with the following: -barbiturates like phenobarbital -blood pressure medicines -corticosteroids like prednisone -diabetic medications -diuretics, especially triamterene, spironolactone or amiloride -lithium -NSAIDs, medicines for pain and inflammation, like ibuprofen or naproxen -potassium salts or potassium supplements -prescription pain medicines -skeletal muscle relaxants like tubocurarine -some cholesterol lowering medications like cholestyramine or colestipol This list may not describe all possible interactions. Give your health care provider a list of all the medicines, herbs, non-prescription drugs, or dietary supplements you use. Also tell them if you smoke, drink alcohol, or use illegal drugs. Some items may interact with your medicine. What should I watch for while using this medicine? Visit your doctor or health care professional for regular checks on your progress. Check your blood pressure as directed. Ask your doctor or health care professional what your blood pressure should be and when you should contact him or her. Call your doctor or health care professional if you notice an irregular or fast heart beat. You must not get dehydrated. Ask your doctor or health care professional how much fluid you need to drink a day. Check with him or her if you get an attack of  severe diarrhea, nausea and vomiting, or if you sweat a lot. The loss of too much body fluid can make it dangerous for you to take this medicine. Women should inform their doctor if they wish to  become pregnant or think they might be pregnant. There is a potential for serious side effects to an unborn child. Talk to your health care professional or pharmacist for more information. You may get drowsy or dizzy. Do not drive, use machinery, or do anything that needs mental alertness until you know how this drug affects you. Do not stand or sit up quickly, especially if you are an older patient. This reduces the risk of dizzy or fainting spells. Alcohol can make you more drowsy and dizzy. Avoid alcoholic drinks. This medicine may affect your blood sugar level. If you have diabetes, check with your doctor or health care professional before changing the dose of your diabetic medicine. Avoid salt substitutes unless you are told otherwise by your doctor or health care professional. This medicine can make you more sensitive to the sun. Keep out of the sun. If you cannot avoid being in the sun, wear protective clothing and use sunscreen. Do not use sun lamps or tanning beds/booths. Do not treat yourself for coughs, colds, or pain while you are taking this medicine without asking your doctor or health care professional for advice. Some ingredients may increase your blood pressure. What side effects may I notice from receiving this medicine? Side effects that you should report to your doctor or health care professional as soon as possible: -changes in vision -confusion, dizziness, light headedness or fainting spells -decreased amount of urine passed -difficulty breathing or swallowing, hoarseness, or tightening of the throat -eye pain -fast or irregular heart beat, palpitations, or chest pain -muscle cramps -nausea and vomiting -persistent dry cough -redness, blistering, peeling or loosening of the skin, including inside the mouth -stomach pain -swelling of your face, lips, tongue, hands, or feet -unusual rash, bleeding or bruising, or pinpoint red spots on the skin -worsened gout pain -yellowing  of the eyes or skin Side effects that usually do not require medical attention (report to your doctor or health care professional if they continue or are bothersome): -change in sex drive or performance -cough -headache This list may not describe all possible side effects. Call your doctor for medical advice about side effects. You may report side effects to FDA at 1-800-FDA-1088. Where should I keep my medicine? Keep out of the reach of children. Store at room temperature between 20 and 25 degrees C (68 and 77 degrees F). Protect from moisture and excessive light. Keep container tightly closed. Throw away any unused medicine after the expiration date. NOTE: This sheet is a summary. It may not cover all possible information. If you have questions about this medicine, talk to your doctor, pharmacist, or health care provider.  2019 Elsevier/Gold Standard (2015-02-11 11:42:20)

## 2018-06-25 ENCOUNTER — Encounter: Payer: Self-pay | Admitting: Family Medicine

## 2018-06-25 DIAGNOSIS — I10 Essential (primary) hypertension: Secondary | ICD-10-CM

## 2018-06-25 HISTORY — DX: Essential (primary) hypertension: I10

## 2018-07-28 ENCOUNTER — Ambulatory Visit: Payer: 59 | Admitting: Family Medicine

## 2018-08-06 ENCOUNTER — Other Ambulatory Visit: Payer: Self-pay

## 2018-08-06 ENCOUNTER — Encounter: Payer: Self-pay | Admitting: Family Medicine

## 2018-08-06 ENCOUNTER — Ambulatory Visit (INDEPENDENT_AMBULATORY_CARE_PROVIDER_SITE_OTHER): Payer: 59 | Admitting: Family Medicine

## 2018-08-06 VITALS — BP 161/84 | HR 85 | Temp 98.2°F | Wt 195.0 lb

## 2018-08-06 DIAGNOSIS — I10 Essential (primary) hypertension: Secondary | ICD-10-CM | POA: Diagnosis not present

## 2018-08-06 MED ORDER — LISINOPRIL-HYDROCHLOROTHIAZIDE 20-25 MG PO TABS: 1.0000 | ORAL_TABLET | Freq: Every day | ORAL | 1 refills | Status: AC

## 2018-08-06 NOTE — Patient Instructions (Addendum)
Thank you for coming in today. Increase the blood pressure pill to 20/25 daily.  That may not be enough and I will will likely add another medicine.  Recheck nurse visit in 2 weeks to check blood pressure.   I will likely add Amlodipine next.   Amlodipine tablets What is this medicine? AMLODIPINE (am LOE di peen) is a calcium-channel blocker. It affects the amount of calcium found in your heart and muscle cells. This relaxes your blood vessels, which can reduce the amount of work the heart has to do. This medicine is used to lower high blood pressure. It is also used to prevent chest pain. This medicine may be used for other purposes; ask your health care provider or pharmacist if you have questions. COMMON BRAND NAME(S): Norvasc What should I tell my health care provider before I take this medicine? They need to know if you have any of these conditions:  heart disease  liver disease  an unusual or allergic reaction to amlodipine, other medicines, foods, dyes, or preservatives  pregnant or trying to get pregnant  breast-feeding How should I use this medicine? Take this medicine by mouth with a glass of water. Follow the directions on the prescription label. You can take it with or without food. If it upsets your stomach, take it with food. Take your medicine at regular intervals. Do not take it more often than directed. Do not stop taking except on your doctor's advice. Talk to your pediatrician regarding the use of this medicine in children. While this drug may be prescribed for children as young as 6 years for selected conditions, precautions do apply. Patients over 22 years of age may have a stronger reaction and need a smaller dose. Overdosage: If you think you have taken too much of this medicine contact a poison control center or emergency room at once. NOTE: This medicine is only for you. Do not share this medicine with others. What if I miss a dose? If you miss a dose, take it  as soon as you can. If it is almost time for your next dose, take only that dose. Do not take double or extra doses. What may interact with this medicine? Do not take this medicine with any of the following medications:  tranylcypromine This medicine may also interact with the following medications:  clarithromycin  cyclosporine  diltiazem  itraconazole  simvastatin  tacrolimus This list may not describe all possible interactions. Give your health care provider a list of all the medicines, herbs, non-prescription drugs, or dietary supplements you use. Also tell them if you smoke, drink alcohol, or use illegal drugs. Some items may interact with your medicine. What should I watch for while using this medicine? Visit your healthcare professional for regular checks on your progress. Check your blood pressure as directed. Ask your healthcare professional what your blood pressure should be and when you should contact him or her. Do not treat yourself for coughs, colds, or pain while you are using this medicine without asking your healthcare professional for advice. Some medicines may increase your blood pressure. You may get dizzy. Do not drive, use machinery, or do anything that needs mental alertness until you know how this medicine affects you. Do not stand or sit up quickly, especially if you are an older patient. This reduces the risk of dizzy or fainting spells. Avoid alcoholic drinks; they can make you dizzier. What side effects may I notice from receiving this medicine? Side effects that you should  report to your doctor or health care professional as soon as possible:  allergic reactions like skin rash, itching or hives; swelling of the face, lips, or tongue  fast, irregular heartbeat  signs and symptoms of low blood pressure like dizziness; feeling faint or lightheaded, falls; unusually weak or tired  swelling of ankles, feet, hands Side effects that usually do not require  medical attention (report these to your doctor or health care professional if they continue or are bothersome):  dry mouth  facial flushing  headache  stomach pain  tiredness This list may not describe all possible side effects. Call your doctor for medical advice about side effects. You may report side effects to FDA at 1-800-FDA-1088. Where should I keep my medicine? Keep out of the reach of children. Store at room temperature between 59 and 86 degrees F (15 and 30 degrees C). Throw away any unused medicine after the expiration date. NOTE: This sheet is a summary. It may not cover all possible information. If you have questions about this medicine, talk to your doctor, pharmacist, or health care provider.  2020 [employer]/Gold Standard (2017-07-12 15:07:10)

## 2018-08-06 NOTE — Progress Notes (Signed)
Juan Horton is a 63 y.o. male who presents to [facility] today for hypertension follow-up.  Patient has been seen several times.  He was first establish care with me in May after an urgent care visit showed metastatic prostate cancer.  He is been well cared for so far with oncology and urology and radiation oncology for his prostate cancer.  However he had high blood pressure.  We addressed this last about 6 weeks ago on June 23.  Lisinopril/hydrochlorothiazide 10/12.5 was started.  And he was advised to follow-up with me today.  In the interim he notes that he feels well.  His oncology team started him on prednisone and has had some weight gain.  No chest pain palpitation shortness of breath lightheadedness or dizziness.   ROS as above:  Exam:  BP (!) 161/84   Pulse 85   Temp 98.2 F (36.8 C) (Oral)   Wt 195 lb (88.5 kg)   BMI 27.20 kg/m  Wt Readings from Last 5 Encounters:  08/06/18 195 lb (88.5 kg)  06/24/18 174 lb (78.9 kg)  05/27/18 175 lb (79.4 kg)  05/12/18 175 lb (79.4 kg)  05/09/18 180 lb (81.6 kg)    Gen: Well NAD HEENT: EOMI,  MMM Lungs: Normal work of breathing. CTABL Heart: RRR no MRG Abd: NABS, Soft. Nondistended, Nontender Exts: Brisk capillary refill, warm and well perfused.   Lab and Radiology Results Labs from [facility] from mid July reviewed showing stable creatinine and potassium.   Assessment and Plan: 63 y.o. male with hypertension not controlled.  Increase lisinopril/hydrochlorothiazide 20/25.  Recheck via nurse visit in 2 weeks.  If blood pressure still above goal of 120/70 will add amlodipine 10 mg and CMP again.  If starting blood pressure medicine will check back again 2 weeks after the next visit in nurse visit.  PDMP not reviewed this encounter. No orders of the defined types were placed in this encounter.  Meds ordered this  encounter  Medications  . lisinopril-hydrochlorothiazide (ZESTORETIC) 20-25 MG tablet    Sig: Take 1 tablet by mouth daily.    Dispense:  90 tablet    Refill:  1     Historical information moved to improve visibility of documentation.  Past Medical History:  Diagnosis Date  . Chronic pain 05/27/2018  . Elevated PSA 05/12/2018  . HTN (hypertension) 06/25/2018   No past surgical history on file. Social History   Tobacco Use  . Smoking status: Never Smoker  . Smokeless tobacco: Never Used  Substance Use Topics  . Alcohol use: Not Currently   family history is not on file.  Medications: Current Outpatient Medications  Medication Sig Dispense Refill  . predniSONE (DELTASONE) 5 MG tablet Take 5 mg by mouth daily.    . calcium carbonate (OS-CAL) 1250 (500 Ca) MG chewable tablet Chew by mouth.    . DEGARELIX ACETATE,240 MG DOSE, [place] Inject into the skin. once    . ibuprofen (ADVIL) 800 MG tablet Take 1 tablet (800 mg total) by mouth every 8 (eight) hours as needed. 90 tablet 2  . leuprolide (LUPRON) 11.25 MG injection Inject 11.25 mg into the muscle every 3 (three) months. Per urology    . lisinopril-hydrochlorothiazide (ZESTORETIC) 20-25 MG tablet Take 1 tablet by mouth daily. 90 tablet 1  . polyethylene glycol (MIRALAX / GLYCOLAX) 17 g packet Use 1 packet twice a day for relief of your constipation 14 each 0  .  traMADol (ULTRAM) 50 MG tablet Take 1 tablet (50 mg total) by mouth every 8 (eight) hours. 90 tablet 0  . Vitamin D, Cholecalciferol, 25 MCG (1000 UT) CAPS Take by mouth.     No current facility-administered medications for this visit.    No Known Allergies   Discussed warning signs or symptoms. Please see discharge instructions. Patient expresses understanding.

## 2018-08-20 ENCOUNTER — Ambulatory Visit (INDEPENDENT_AMBULATORY_CARE_PROVIDER_SITE_OTHER): Payer: 59 | Admitting: Family Medicine

## 2018-08-20 VITALS — BP 138/82 | HR 83 | Temp 98.4°F | Ht 71.0 in | Wt 196.9 lb

## 2018-08-20 DIAGNOSIS — Z0130 Encounter for examination of blood pressure without abnormal findings: Secondary | ICD-10-CM

## 2018-08-20 NOTE — Progress Notes (Signed)
Patient presents to clinic for BP check and the initial BP was elevated. I let the patient sit for a few minutes and the pressure came down. Patient did not need any refills at this time. Patient will follow up with PCP as directed. Patient is taking BP medication as directed.

## 2019-02-08 MED ORDER — CYCLOBENZAPRINE HCL 10 MG PO TABS
5.00 | ORAL_TABLET | ORAL | Status: DC
Start: ? — End: 2019-02-08

## 2019-02-08 MED ORDER — INSULIN LISPRO 100 UNIT/ML ~~LOC~~ SOLN
1.00 | SUBCUTANEOUS | Status: DC
Start: ? — End: 2019-02-08

## 2019-02-08 MED ORDER — POLYETHYLENE GLYCOL 3350 17 GM/SCOOP PO POWD
17.00 | ORAL | Status: DC
Start: ? — End: 2019-02-08

## 2019-02-08 MED ORDER — INSULIN GLARGINE 100 UNIT/ML ~~LOC~~ SOLN
1.00 | SUBCUTANEOUS | Status: DC
Start: ? — End: 2019-02-08

## 2019-02-08 MED ORDER — GENERIC EXTERNAL MEDICATION
Status: DC
Start: ? — End: 2019-02-08

## 2019-02-08 MED ORDER — NITROGLYCERIN 0.4 MG SL SUBL
0.40 | SUBLINGUAL_TABLET | SUBLINGUAL | Status: DC
Start: ? — End: 2019-02-08

## 2019-02-08 MED ORDER — AMLODIPINE BESYLATE 5 MG PO TABS
5.00 | ORAL_TABLET | ORAL | Status: DC
Start: 2019-02-08 — End: 2019-02-08

## 2019-02-08 MED ORDER — OXYCODONE HCL 5 MG PO TABS
5.00 | ORAL_TABLET | ORAL | Status: DC
Start: ? — End: 2019-02-08

## 2019-02-08 MED ORDER — POLYETHYLENE GLYCOL 3350 17 GM/SCOOP PO POWD
17.00 | ORAL | Status: DC
Start: 2019-02-08 — End: 2019-02-08

## 2019-02-08 MED ORDER — LABETALOL HCL 5 MG/ML IV SOLN
20.00 | INTRAVENOUS | Status: DC
Start: ? — End: 2019-02-08

## 2019-02-08 MED ORDER — SODIUM CHLORIDE 0.9 % IV SOLN
10.00 | INTRAVENOUS | Status: DC
Start: ? — End: 2019-02-08

## 2019-02-08 MED ORDER — GENERIC EXTERNAL MEDICATION
2.00 | Status: DC
Start: 2019-02-08 — End: 2019-02-08

## 2019-02-08 MED ORDER — INSULIN GLARGINE 100 UNIT/ML ~~LOC~~ SOLN
1.00 | SUBCUTANEOUS | Status: DC
Start: 2019-02-07 — End: 2019-02-08

## 2019-02-08 MED ORDER — FERROUS SULFATE 325 (65 FE) MG PO TABS
325.00 | ORAL_TABLET | ORAL | Status: DC
Start: 2019-02-08 — End: 2019-02-08

## 2019-02-08 MED ORDER — HEPARIN SODIUM (PORCINE) 5000 UNIT/ML IJ SOLN
5000.00 | INTRAMUSCULAR | Status: DC
Start: 2019-02-07 — End: 2019-02-08

## 2019-02-08 MED ORDER — INSULIN LISPRO 100 UNIT/ML ~~LOC~~ SOLN
1.00 | SUBCUTANEOUS | Status: DC
Start: 2019-02-07 — End: 2019-02-08

## 2019-02-08 MED ORDER — PREDNISONE 1 MG PO TABS
2.50 | ORAL_TABLET | ORAL | Status: DC
Start: 2019-02-08 — End: 2019-02-08

## 2019-02-08 MED ORDER — POTASSIUM CHLORIDE IN NACL 20-0.9 MEQ/L-% IV SOLN
50.00 | INTRAVENOUS | Status: DC
Start: ? — End: 2019-02-08

## 2019-05-12 DIAGNOSIS — I10 Essential (primary) hypertension: Secondary | ICD-10-CM | POA: Diagnosis not present

## 2019-05-12 DIAGNOSIS — N522 Drug-induced erectile dysfunction: Secondary | ICD-10-CM | POA: Diagnosis not present

## 2019-05-12 DIAGNOSIS — C61 Malignant neoplasm of prostate: Secondary | ICD-10-CM | POA: Diagnosis not present

## 2019-05-12 DIAGNOSIS — R339 Retention of urine, unspecified: Secondary | ICD-10-CM | POA: Diagnosis not present

## 2019-05-12 DIAGNOSIS — Z87440 Personal history of urinary (tract) infections: Secondary | ICD-10-CM | POA: Diagnosis not present

## 2019-05-12 DIAGNOSIS — N1339 Other hydronephrosis: Secondary | ICD-10-CM | POA: Diagnosis not present

## 2019-05-21 DIAGNOSIS — C61 Malignant neoplasm of prostate: Secondary | ICD-10-CM | POA: Diagnosis not present

## 2019-05-21 DIAGNOSIS — D649 Anemia, unspecified: Secondary | ICD-10-CM | POA: Diagnosis not present

## 2019-05-21 DIAGNOSIS — E119 Type 2 diabetes mellitus without complications: Secondary | ICD-10-CM | POA: Diagnosis not present

## 2019-05-21 DIAGNOSIS — Z794 Long term (current) use of insulin: Secondary | ICD-10-CM | POA: Diagnosis not present

## 2019-06-23 DIAGNOSIS — Z5111 Encounter for antineoplastic chemotherapy: Secondary | ICD-10-CM | POA: Diagnosis not present

## 2019-06-23 DIAGNOSIS — C61 Malignant neoplasm of prostate: Secondary | ICD-10-CM | POA: Diagnosis not present

## 2019-10-06 DIAGNOSIS — E1165 Type 2 diabetes mellitus with hyperglycemia: Secondary | ICD-10-CM | POA: Diagnosis not present

## 2019-10-12 DIAGNOSIS — E871 Hypo-osmolality and hyponatremia: Secondary | ICD-10-CM | POA: Diagnosis not present

## 2019-10-12 DIAGNOSIS — I10 Essential (primary) hypertension: Secondary | ICD-10-CM | POA: Diagnosis not present

## 2019-10-12 DIAGNOSIS — D630 Anemia in neoplastic disease: Secondary | ICD-10-CM | POA: Diagnosis not present

## 2019-10-12 DIAGNOSIS — C7951 Secondary malignant neoplasm of bone: Secondary | ICD-10-CM | POA: Diagnosis not present

## 2019-10-12 DIAGNOSIS — C61 Malignant neoplasm of prostate: Secondary | ICD-10-CM | POA: Diagnosis not present

## 2019-10-12 DIAGNOSIS — E119 Type 2 diabetes mellitus without complications: Secondary | ICD-10-CM | POA: Diagnosis not present

## 2019-10-20 DIAGNOSIS — E1165 Type 2 diabetes mellitus with hyperglycemia: Secondary | ICD-10-CM | POA: Diagnosis not present

## 2019-11-18 DIAGNOSIS — E1165 Type 2 diabetes mellitus with hyperglycemia: Secondary | ICD-10-CM | POA: Diagnosis not present

## 2019-11-20 DIAGNOSIS — N529 Male erectile dysfunction, unspecified: Secondary | ICD-10-CM | POA: Diagnosis not present

## 2019-11-20 DIAGNOSIS — C61 Malignant neoplasm of prostate: Secondary | ICD-10-CM | POA: Diagnosis not present

## 2019-11-20 DIAGNOSIS — N401 Enlarged prostate with lower urinary tract symptoms: Secondary | ICD-10-CM | POA: Diagnosis not present

## 2019-12-24 DIAGNOSIS — C7951 Secondary malignant neoplasm of bone: Secondary | ICD-10-CM | POA: Diagnosis not present

## 2019-12-24 DIAGNOSIS — E278 Other specified disorders of adrenal gland: Secondary | ICD-10-CM | POA: Diagnosis not present

## 2019-12-24 DIAGNOSIS — M25551 Pain in right hip: Secondary | ICD-10-CM | POA: Diagnosis not present

## 2019-12-24 DIAGNOSIS — C61 Malignant neoplasm of prostate: Secondary | ICD-10-CM | POA: Diagnosis not present

## 2019-12-24 DIAGNOSIS — R972 Elevated prostate specific antigen [PSA]: Secondary | ICD-10-CM | POA: Diagnosis not present

## 2019-12-28 DIAGNOSIS — C61 Malignant neoplasm of prostate: Secondary | ICD-10-CM | POA: Diagnosis not present

## 2019-12-28 DIAGNOSIS — M899 Disorder of bone, unspecified: Secondary | ICD-10-CM | POA: Diagnosis not present

## 2019-12-29 DIAGNOSIS — C61 Malignant neoplasm of prostate: Secondary | ICD-10-CM | POA: Diagnosis not present

## 2019-12-29 DIAGNOSIS — C7951 Secondary malignant neoplasm of bone: Secondary | ICD-10-CM | POA: Diagnosis not present

## 2019-12-30 DIAGNOSIS — C61 Malignant neoplasm of prostate: Secondary | ICD-10-CM | POA: Diagnosis not present

## 2019-12-30 DIAGNOSIS — C7951 Secondary malignant neoplasm of bone: Secondary | ICD-10-CM | POA: Diagnosis not present

## 2019-12-31 DIAGNOSIS — Z510 Encounter for antineoplastic radiation therapy: Secondary | ICD-10-CM | POA: Diagnosis not present

## 2019-12-31 DIAGNOSIS — C7951 Secondary malignant neoplasm of bone: Secondary | ICD-10-CM | POA: Diagnosis not present

## 2019-12-31 DIAGNOSIS — C61 Malignant neoplasm of prostate: Secondary | ICD-10-CM | POA: Diagnosis not present

## 2020-01-06 DIAGNOSIS — N1330 Unspecified hydronephrosis: Secondary | ICD-10-CM | POA: Diagnosis not present

## 2020-01-06 DIAGNOSIS — C61 Malignant neoplasm of prostate: Secondary | ICD-10-CM | POA: Diagnosis not present

## 2020-01-26 DIAGNOSIS — E1165 Type 2 diabetes mellitus with hyperglycemia: Secondary | ICD-10-CM | POA: Diagnosis not present

## 2020-01-27 DIAGNOSIS — N1330 Unspecified hydronephrosis: Secondary | ICD-10-CM | POA: Diagnosis not present

## 2020-01-27 DIAGNOSIS — C61 Malignant neoplasm of prostate: Secondary | ICD-10-CM | POA: Diagnosis not present

## 2020-08-09 IMAGING — DX CHEST - 2 VIEW
2 series · 2 of 2 positions shown · non-contrast
Comparison: None.

CLINICAL DATA: Left-sided chest pain.  No known injury.

EXAM:
CHEST - 2 VIEW

[chest pa]
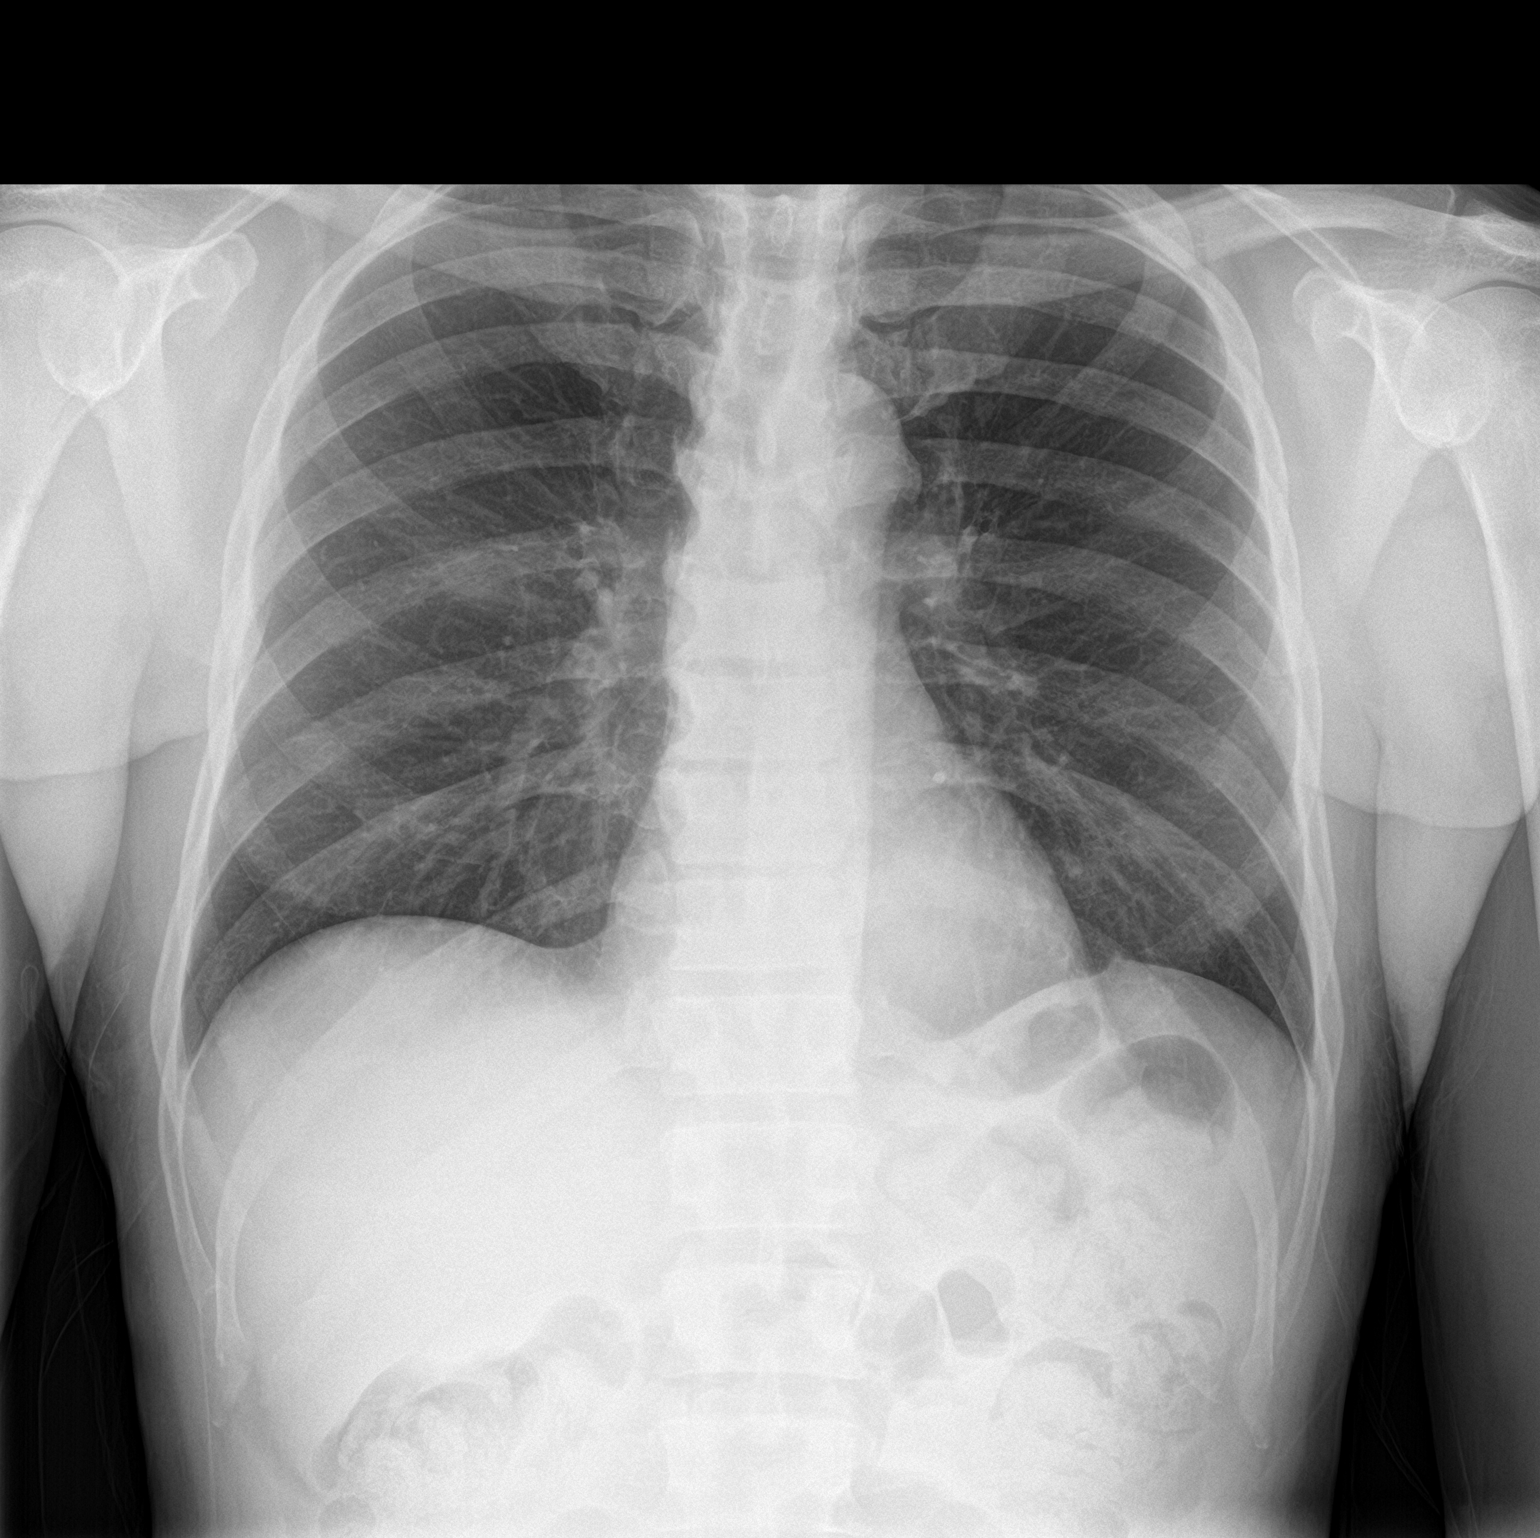

[chest lat]
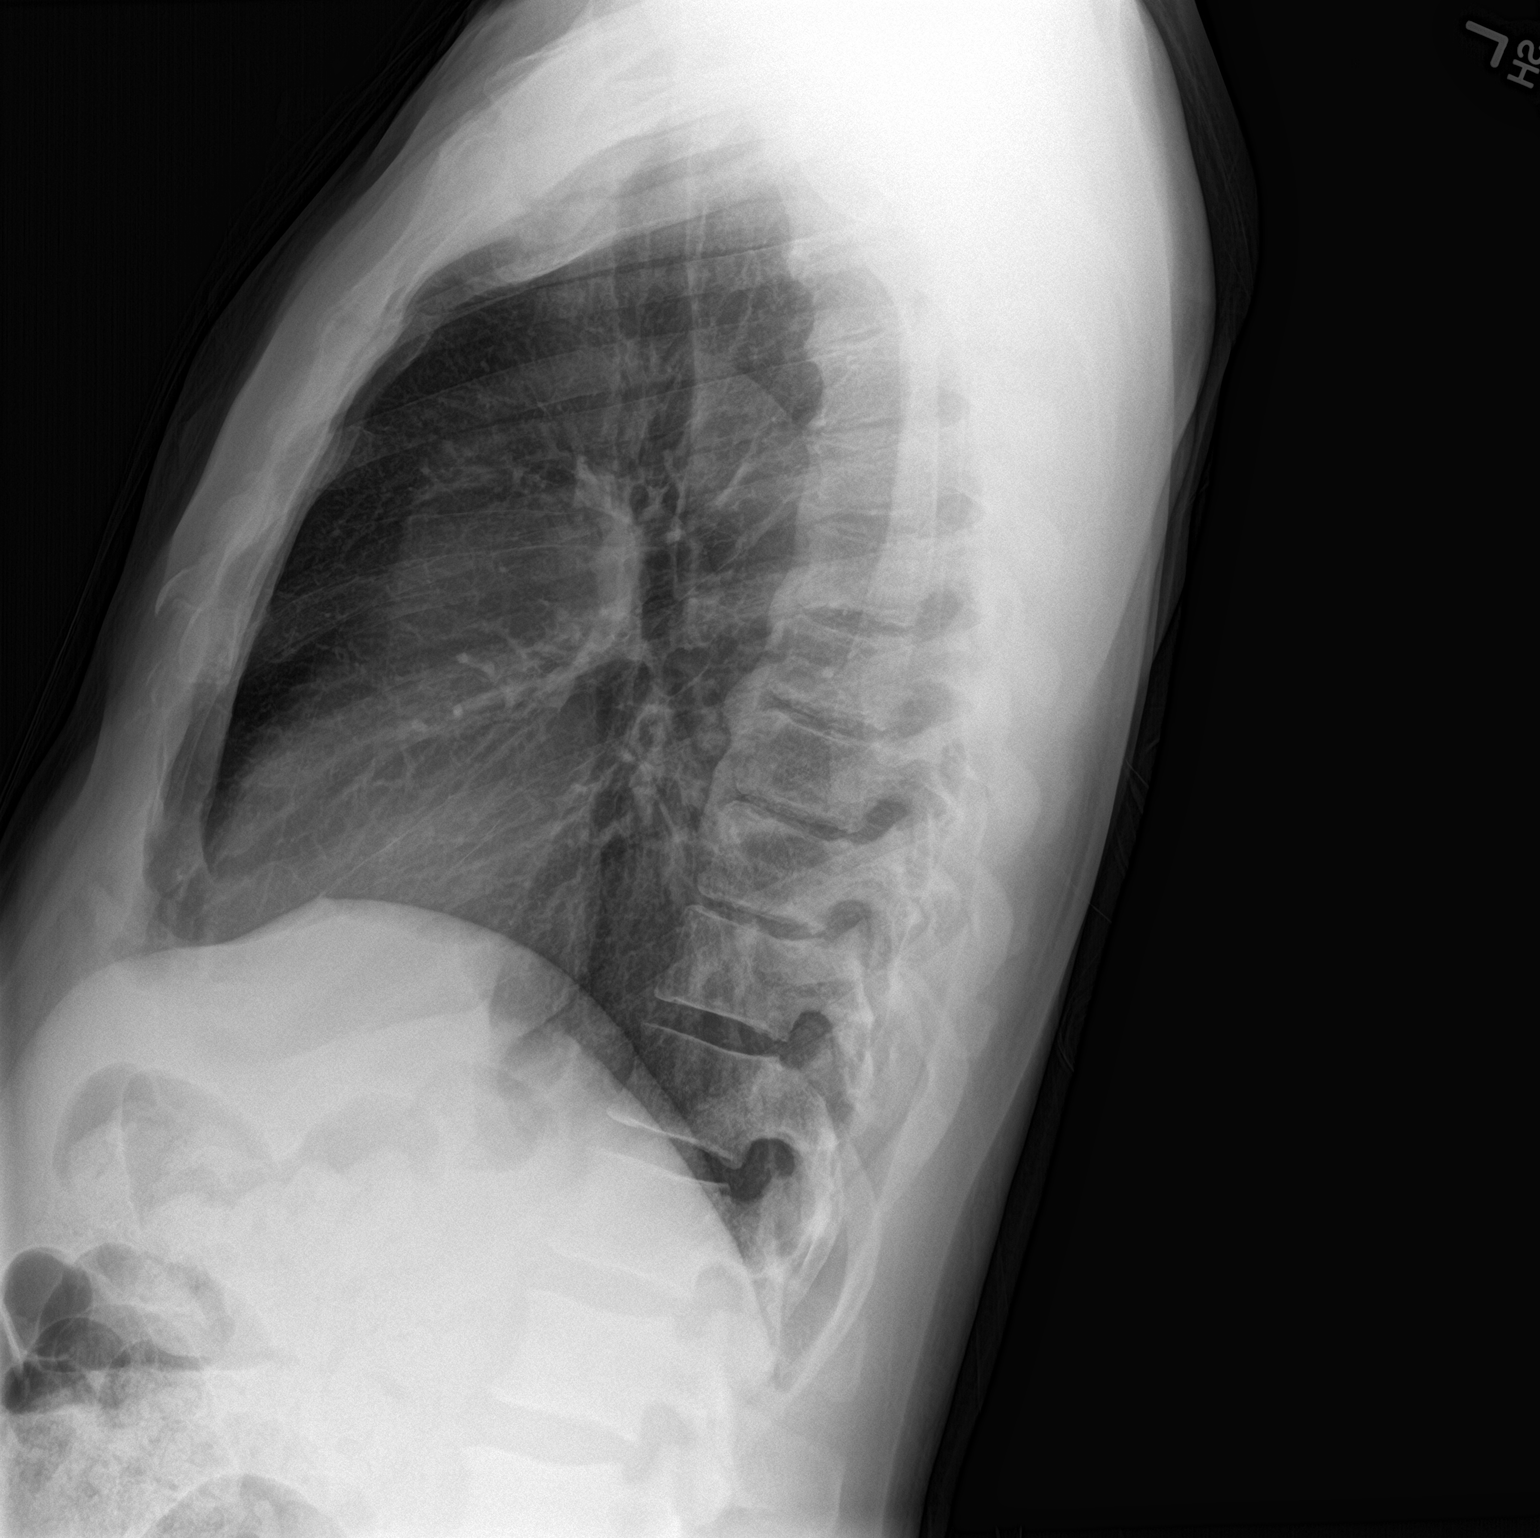

[2 of 2 positions shown; findings below may reference images not displayed]

FINDINGS: Heart size is normal. Mediastinal shadows are normal. The lungs are
clear. No bronchial thickening. No infiltrate, mass, effusion or
collapse. Pulmonary vascularity is normal. There are bridging
osteophytes in the thoracic spine. No sign of thoracic fracture. No
sclerotic changes noted in the thoracic spine or ribs.
IMPRESSION: No active cardiopulmonary disease.

## 2023-04-02 DEATH — deceased
